# Patient Record
Sex: Female | Born: 1965 | Race: White | Hispanic: No | Marital: Married | Smoking: Never smoker
Health system: Southern US, Community
[De-identification: ages and names within clinical notes are randomized; demographics above are authoritative.]

## PROBLEM LIST (undated history)

## (undated) DIAGNOSIS — N920 Excessive and frequent menstruation with regular cycle: Secondary | ICD-10-CM

## (undated) HISTORY — DX: Excessive and frequent menstruation with regular cycle: N92.0

## (undated) HISTORY — PX: TUBAL LIGATION: SHX77

## (undated) HISTORY — PX: OOPHORECTOMY: SHX86

## (undated) HISTORY — PX: TONSILLECTOMY: SUR1361

---

## 2016-01-22 ENCOUNTER — Encounter: Payer: Self-pay | Admitting: Gastroenterology

## 2016-03-14 ENCOUNTER — Ambulatory Visit (AMBULATORY_SURGERY_CENTER): Payer: Self-pay

## 2016-03-14 VITALS — Ht 66.0 in | Wt 146.8 lb

## 2016-03-14 DIAGNOSIS — Z1211 Encounter for screening for malignant neoplasm of colon: Secondary | ICD-10-CM

## 2016-03-14 MED ORDER — SUPREP BOWEL PREP KIT 17.5-3.13-1.6 GM/177ML PO SOLN: 1.0000 | Freq: Once | ORAL | 0 refills | Status: AC

## 2016-03-14 NOTE — Progress Notes (Signed)
No allergies to eggs or soy No past problems with anesthesia or airway No diet meds No home oxygen  Registered for emmi

## 2016-03-18 ENCOUNTER — Encounter: Payer: Self-pay | Admitting: Gastroenterology

## 2016-03-28 ENCOUNTER — Encounter: Payer: Self-pay | Admitting: Gastroenterology

## 2016-05-28 ENCOUNTER — Ambulatory Visit (AMBULATORY_SURGERY_CENTER): Payer: 59 | Admitting: Gastroenterology

## 2016-05-28 ENCOUNTER — Encounter: Payer: Self-pay | Admitting: Gastroenterology

## 2016-05-28 VITALS — BP 102/64 | HR 64 | Temp 98.4°F | Resp 15 | Ht 66.0 in | Wt 146.0 lb

## 2016-05-28 DIAGNOSIS — D125 Benign neoplasm of sigmoid colon: Secondary | ICD-10-CM

## 2016-05-28 DIAGNOSIS — Z1211 Encounter for screening for malignant neoplasm of colon: Secondary | ICD-10-CM | POA: Diagnosis not present

## 2016-05-28 DIAGNOSIS — Z1212 Encounter for screening for malignant neoplasm of rectum: Secondary | ICD-10-CM | POA: Diagnosis not present

## 2016-05-28 DIAGNOSIS — K635 Polyp of colon: Secondary | ICD-10-CM | POA: Diagnosis not present

## 2016-05-28 MED ORDER — SODIUM CHLORIDE 0.9 % IV SOLN: 500.0000 mL | INTRAVENOUS | Status: AC

## 2016-05-28 NOTE — Progress Notes (Signed)
Pt's states no medical or surgical changes since previsit or office visit. 

## 2016-05-28 NOTE — Progress Notes (Signed)
To recovery, report to Jones, RN, VSS 

## 2016-05-28 NOTE — Op Note (Signed)
[facility] Patient Name: Colleen Wolf Procedure Date: 05/28/2016 8:56 AM MRN: [id number] Endoscopist: Milus Banister , MD Age: 51 Referring MD:  Date of Birth: Mar 01, 1966 Gender: Female Account #: [id number] Procedure:                Colonoscopy Indications:              Screening for colorectal malignant neoplasm Medicines:                Monitored Anesthesia Care Procedure:                Pre-Anesthesia Assessment:                           - Prior to the procedure, a History and Physical                            was performed, and patient medications and                            allergies were reviewed. The patient's tolerance of                            previous anesthesia was also reviewed. The risks                            and benefits of the procedure and the sedation                            options and risks were discussed with the patient.                            All questions were answered, and informed consent                            was obtained. Prior Anticoagulants: The patient has                            taken no previous anticoagulant or antiplatelet                            agents. ASA Grade Assessment: II - A patient with                            mild systemic disease. After reviewing the risks                            and benefits, the patient was deemed in                            satisfactory condition to undergo the procedure.                           After obtaining informed consent, the colonoscope  was passed under direct vision. Throughout the                            procedure, the patient's blood pressure, pulse, and                            oxygen saturations were monitored continuously. The                            Model CF-HQ190L [id number]) scope was introduced                            through the anus and advanced to the the cecum,                            identified by  appendiceal orifice and ileocecal                            valve. The colonoscopy was performed without                            difficulty. The patient tolerated the procedure                            well. The quality of the bowel preparation was                            good. The ileocecal valve, appendiceal orifice, and                            rectum were photographed. Scope In: 8:57:52 AM Scope Out: 9:09:02 AM Scope Withdrawal Time: 0 hours 7 minutes 6 seconds  Total Procedure Duration: 0 hours 11 minutes 10 seconds  Findings:                 A 5 mm polyp was found in the sigmoid colon. The                            polyp was sessile. The polyp was removed with a                            cold snare. Resection and retrieval were complete.                           The exam was otherwise without abnormality on                            direct and retroflexion views. Complications:            No immediate complications. Estimated blood loss:                            None. Estimated Blood Loss:     Estimated blood loss: none. Impression:               -  One 5 mm polyp in the sigmoid colon, removed with                            a cold snare. Resected and retrieved.                           - The examination was otherwise normal on direct                            and retroflexion views. Recommendation:           - Patient has a contact number available for                            emergencies. The signs and symptoms of potential                            delayed complications were discussed with the                            patient. Return to normal activities tomorrow.                            Written discharge instructions were provided to the                            patient.                           - Resume previous diet.                           - Continue present medications.                           You will receive a letter within 2-3 weeks with  the                            pathology results and my final recommendations.                           If the polyp(s) is proven to be 'pre-cancerous' on                            pathology, you will need repeat colonoscopy in 5                            years. If the polyp(s) is NOT 'precancerous' on                            pathology then you should repeat colon cancer                            screening in 10 years with colonoscopy without need  for colon cancer screening by any method prior to                            then (including stool testing). Milus Banister, MD 05/28/2016 9:10:39 AM This report has been signed electronically.

## 2016-05-28 NOTE — Progress Notes (Signed)
Called to room to assist during endoscopic procedure.  Patient ID and intended procedure confirmed with present staff. Received instructions for my participation in the procedure from the performing physician.  

## 2016-05-28 NOTE — Patient Instructions (Signed)
YOU HAD AN ENDOSCOPIC PROCEDURE TODAY AT [facility]:   Refer to the procedure report that was given to you for any specific questions about what was found during the examination.  If the procedure report does not answer your questions, please call your gastroenterologist to clarify.  If you requested that your care partner not be given the details of your procedure findings, then the procedure report has been included in a sealed envelope for you to review at your convenience later.  YOU SHOULD EXPECT: Some feelings of bloating in the abdomen. Passage of more gas than usual.  Walking can help get rid of the air that was put into your GI tract during the procedure and reduce the bloating. If you had a lower endoscopy (such as a colonoscopy or flexible sigmoidoscopy) you may notice spotting of blood in your stool or on the toilet paper. If you underwent a bowel prep for your procedure, you may not have a normal bowel movement for a few days.  Please Note:  You might notice some irritation and congestion in your nose or some drainage.  This is from the oxygen used during your procedure.  There is no need for concern and it should clear up in a day or so.  SYMPTOMS TO REPORT IMMEDIATELY:   Following lower endoscopy (colonoscopy or flexible sigmoidoscopy):  Excessive amounts of blood in the stool  Significant tenderness or worsening of abdominal pains  Swelling of the abdomen that is new, acute  Fever of 100F or higher  For urgent or emergent issues, a gastroenterologist can be reached at any hour by calling [phone].   DIET:  We do recommend a small meal at first, but then you may proceed to your regular diet.  Drink plenty of fluids but you should avoid alcoholic beverages for 24 hours.  ACTIVITY:  You should plan to take it easy for the rest of today and you should NOT DRIVE or use heavy machinery until tomorrow (because of the sedation medicines used during the test).     FOLLOW UP: Our staff will call the number listed on your records the next business day following your procedure to check on you and address any questions or concerns that you may have regarding the information given to you following your procedure. If we do not reach you, we will leave a message.  However, if you are feeling well and you are not experiencing any problems, there is no need to return our call.  We will assume that you have returned to your regular daily activities without incident.  If any biopsies were taken you will be contacted by phone or by letter within the next 1-3 weeks.  Please call us at [phone] if you have not heard about the biopsies in 3 weeks.   Polyps (handout given) Await biopsy results to determined next repeat Colonoscopy   SIGNATURES/CONFIDENTIALITY: You and/or your care partner have signed paperwork which will be entered into your electronic medical record.  These signatures attest to the fact that that the information above on your After Visit Summary has been reviewed and is understood.  Full responsibility of the confidentiality of this discharge information lies with you and/or your care-partner.

## 2016-05-29 ENCOUNTER — Telehealth: Payer: Self-pay | Admitting: *Deleted

## 2016-05-29 ENCOUNTER — Telehealth: Payer: Self-pay

## 2016-05-29 NOTE — Telephone Encounter (Signed)
Left message on f/u call 

## 2016-05-29 NOTE — Telephone Encounter (Signed)
  Follow up Call-  Call back number 05/28/2016  Post procedure Call Back phone  # [phone]  Permission to leave phone message Yes     Patient questions:  Do you have a fever, pain , or abdominal swelling? No. Pain Score  0 *  Have you tolerated food without any problems? Yes.    Have you been able to return to your normal activities? Yes.    Do you have any questions about your discharge instructions: Diet   No. Medications  No. Follow up visit  No.  Do you have questions or concerns about your Care? No.  Actions: * If pain score is 4 or above: No action needed, pain <4.

## 2016-06-05 ENCOUNTER — Encounter: Payer: Self-pay | Admitting: Gastroenterology

## 2016-07-18 ENCOUNTER — Other Ambulatory Visit: Payer: Self-pay | Admitting: Family Medicine

## 2016-07-18 DIAGNOSIS — Z1231 Encounter for screening mammogram for malignant neoplasm of breast: Secondary | ICD-10-CM

## 2016-08-19 ENCOUNTER — Encounter: Payer: Self-pay | Admitting: Radiology

## 2016-08-19 ENCOUNTER — Ambulatory Visit
Admission: RE | Admit: 2016-08-19 | Discharge: 2016-08-19 | Disposition: A | Discharge status: Home / Self Care | Payer: 59 | Source: Ambulatory Visit | Attending: Family Medicine | Admitting: Family Medicine

## 2016-08-19 DIAGNOSIS — Z1231 Encounter for screening mammogram for malignant neoplasm of breast: Secondary | ICD-10-CM

## 2016-09-15 DIAGNOSIS — Z23 Encounter for immunization: Secondary | ICD-10-CM | POA: Diagnosis not present

## 2017-01-09 DIAGNOSIS — Z23 Encounter for immunization: Secondary | ICD-10-CM | POA: Diagnosis not present

## 2017-07-20 DIAGNOSIS — Z13220 Encounter for screening for lipoid disorders: Secondary | ICD-10-CM | POA: Diagnosis not present

## 2017-07-23 ENCOUNTER — Other Ambulatory Visit: Payer: Self-pay | Admitting: Family Medicine

## 2017-07-23 DIAGNOSIS — Z1211 Encounter for screening for malignant neoplasm of colon: Secondary | ICD-10-CM | POA: Diagnosis not present

## 2017-07-23 DIAGNOSIS — Z0000 Encounter for general adult medical examination without abnormal findings: Secondary | ICD-10-CM | POA: Diagnosis not present

## 2017-07-23 DIAGNOSIS — Z6820 Body mass index (BMI) 20.0-20.9, adult: Secondary | ICD-10-CM | POA: Diagnosis not present

## 2017-07-23 DIAGNOSIS — Z1231 Encounter for screening mammogram for malignant neoplasm of breast: Secondary | ICD-10-CM

## 2017-08-21 ENCOUNTER — Other Ambulatory Visit: Payer: Self-pay | Admitting: Family Medicine

## 2017-08-21 ENCOUNTER — Ambulatory Visit
Admission: RE | Admit: 2017-08-21 | Discharge: 2017-08-21 | Disposition: A | Discharge status: Home / Self Care | Payer: BLUE CROSS/BLUE SHIELD | Source: Ambulatory Visit | Attending: Family Medicine | Admitting: Family Medicine

## 2017-08-21 DIAGNOSIS — Z1231 Encounter for screening mammogram for malignant neoplasm of breast: Secondary | ICD-10-CM

## 2018-06-29 DIAGNOSIS — Z0000 Encounter for general adult medical examination without abnormal findings: Secondary | ICD-10-CM | POA: Diagnosis not present

## 2018-06-29 DIAGNOSIS — Z114 Encounter for screening for human immunodeficiency virus [HIV]: Secondary | ICD-10-CM | POA: Diagnosis not present

## 2018-06-29 DIAGNOSIS — Z1329 Encounter for screening for other suspected endocrine disorder: Secondary | ICD-10-CM | POA: Diagnosis not present

## 2018-06-29 DIAGNOSIS — Z13220 Encounter for screening for lipoid disorders: Secondary | ICD-10-CM | POA: Diagnosis not present

## 2018-07-06 DIAGNOSIS — Z1211 Encounter for screening for malignant neoplasm of colon: Secondary | ICD-10-CM | POA: Diagnosis not present

## 2018-07-06 DIAGNOSIS — Z6821 Body mass index (BMI) 21.0-21.9, adult: Secondary | ICD-10-CM | POA: Diagnosis not present

## 2018-07-06 DIAGNOSIS — Z0000 Encounter for general adult medical examination without abnormal findings: Secondary | ICD-10-CM | POA: Diagnosis not present

## 2018-07-22 ENCOUNTER — Other Ambulatory Visit: Payer: Self-pay | Admitting: Family Medicine

## 2018-07-22 DIAGNOSIS — Z1231 Encounter for screening mammogram for malignant neoplasm of breast: Secondary | ICD-10-CM

## 2018-09-14 ENCOUNTER — Ambulatory Visit
Admission: RE | Admit: 2018-09-14 | Discharge: 2018-09-14 | Disposition: A | Discharge status: Home / Self Care | Payer: BLUE CROSS/BLUE SHIELD | Source: Ambulatory Visit | Attending: Family Medicine | Admitting: Family Medicine

## 2018-09-14 ENCOUNTER — Other Ambulatory Visit: Payer: Self-pay

## 2018-09-14 DIAGNOSIS — Z1231 Encounter for screening mammogram for malignant neoplasm of breast: Secondary | ICD-10-CM

## 2018-12-31 DIAGNOSIS — L255 Unspecified contact dermatitis due to plants, except food: Secondary | ICD-10-CM | POA: Diagnosis not present

## 2019-02-07 ENCOUNTER — Other Ambulatory Visit: Payer: Self-pay

## 2019-02-07 DIAGNOSIS — Z20822 Contact with and (suspected) exposure to covid-19: Secondary | ICD-10-CM

## 2019-02-07 DIAGNOSIS — Z20828 Contact with and (suspected) exposure to other viral communicable diseases: Secondary | ICD-10-CM | POA: Diagnosis not present

## 2019-02-08 LAB — NOVEL CORONAVIRUS, NAA: SARS-CoV-2, NAA: NOT DETECTED

## 2019-07-05 DIAGNOSIS — Z0000 Encounter for general adult medical examination without abnormal findings: Secondary | ICD-10-CM | POA: Diagnosis not present

## 2019-07-05 DIAGNOSIS — Z0184 Encounter for antibody response examination: Secondary | ICD-10-CM | POA: Diagnosis not present

## 2019-07-12 DIAGNOSIS — Z1211 Encounter for screening for malignant neoplasm of colon: Secondary | ICD-10-CM | POA: Diagnosis not present

## 2019-07-12 DIAGNOSIS — Z01419 Encounter for gynecological examination (general) (routine) without abnormal findings: Secondary | ICD-10-CM | POA: Diagnosis not present

## 2019-07-12 DIAGNOSIS — Z6821 Body mass index (BMI) 21.0-21.9, adult: Secondary | ICD-10-CM | POA: Diagnosis not present

## 2019-07-12 DIAGNOSIS — Z0000 Encounter for general adult medical examination without abnormal findings: Secondary | ICD-10-CM | POA: Diagnosis not present

## 2019-09-13 DIAGNOSIS — Z23 Encounter for immunization: Secondary | ICD-10-CM | POA: Diagnosis not present

## 2019-11-24 DIAGNOSIS — Z23 Encounter for immunization: Secondary | ICD-10-CM | POA: Diagnosis not present

## 2020-07-20 DIAGNOSIS — E559 Vitamin D deficiency, unspecified: Secondary | ICD-10-CM | POA: Diagnosis not present

## 2020-07-20 DIAGNOSIS — Z13220 Encounter for screening for lipoid disorders: Secondary | ICD-10-CM | POA: Diagnosis not present

## 2020-07-20 DIAGNOSIS — Z0000 Encounter for general adult medical examination without abnormal findings: Secondary | ICD-10-CM | POA: Diagnosis not present

## 2020-07-25 DIAGNOSIS — Z0000 Encounter for general adult medical examination without abnormal findings: Secondary | ICD-10-CM | POA: Diagnosis not present

## 2020-07-25 DIAGNOSIS — E559 Vitamin D deficiency, unspecified: Secondary | ICD-10-CM | POA: Diagnosis not present

## 2020-07-25 DIAGNOSIS — N952 Postmenopausal atrophic vaginitis: Secondary | ICD-10-CM | POA: Diagnosis not present

## 2020-07-25 DIAGNOSIS — Z780 Asymptomatic menopausal state: Secondary | ICD-10-CM | POA: Diagnosis not present

## 2020-07-25 DIAGNOSIS — Z1211 Encounter for screening for malignant neoplasm of colon: Secondary | ICD-10-CM | POA: Diagnosis not present

## 2020-08-07 ENCOUNTER — Other Ambulatory Visit: Payer: Self-pay | Admitting: Family Medicine

## 2020-08-07 DIAGNOSIS — Z1231 Encounter for screening mammogram for malignant neoplasm of breast: Secondary | ICD-10-CM

## 2020-10-03 ENCOUNTER — Ambulatory Visit
Admission: RE | Admit: 2020-10-03 | Discharge: 2020-10-03 | Disposition: A | Discharge status: Home / Self Care | Payer: BLUE CROSS/BLUE SHIELD | Source: Ambulatory Visit | Attending: Family Medicine | Admitting: Family Medicine

## 2020-10-03 ENCOUNTER — Other Ambulatory Visit: Payer: Self-pay

## 2020-10-03 ENCOUNTER — Ambulatory Visit: Payer: BLUE CROSS/BLUE SHIELD

## 2020-10-03 DIAGNOSIS — Z1231 Encounter for screening mammogram for malignant neoplasm of breast: Secondary | ICD-10-CM

## 2021-06-06 DIAGNOSIS — J309 Allergic rhinitis, unspecified: Secondary | ICD-10-CM | POA: Diagnosis not present

## 2021-06-06 DIAGNOSIS — H9313 Tinnitus, bilateral: Secondary | ICD-10-CM | POA: Diagnosis not present

## 2021-06-06 DIAGNOSIS — H65193 Other acute nonsuppurative otitis media, bilateral: Secondary | ICD-10-CM | POA: Diagnosis not present

## 2021-07-23 DIAGNOSIS — Z0000 Encounter for general adult medical examination without abnormal findings: Secondary | ICD-10-CM | POA: Diagnosis not present

## 2021-07-23 DIAGNOSIS — Z114 Encounter for screening for human immunodeficiency virus [HIV]: Secondary | ICD-10-CM | POA: Diagnosis not present

## 2021-07-29 DIAGNOSIS — Z0000 Encounter for general adult medical examination without abnormal findings: Secondary | ICD-10-CM | POA: Diagnosis not present

## 2021-07-29 DIAGNOSIS — Z1211 Encounter for screening for malignant neoplasm of colon: Secondary | ICD-10-CM | POA: Diagnosis not present

## 2021-07-31 ENCOUNTER — Other Ambulatory Visit: Payer: Self-pay | Admitting: Family Medicine

## 2021-07-31 DIAGNOSIS — Z1231 Encounter for screening mammogram for malignant neoplasm of breast: Secondary | ICD-10-CM

## 2021-10-04 ENCOUNTER — Ambulatory Visit
Admission: RE | Admit: 2021-10-04 | Discharge: 2021-10-04 | Disposition: A | Discharge status: Home / Self Care | Payer: BC Managed Care – PPO | Source: Ambulatory Visit | Attending: Family Medicine | Admitting: Family Medicine

## 2021-10-04 DIAGNOSIS — Z1231 Encounter for screening mammogram for malignant neoplasm of breast: Secondary | ICD-10-CM

## 2021-12-04 DIAGNOSIS — H903 Sensorineural hearing loss, bilateral: Secondary | ICD-10-CM | POA: Diagnosis not present

## 2021-12-04 DIAGNOSIS — H9193 Unspecified hearing loss, bilateral: Secondary | ICD-10-CM | POA: Diagnosis not present

## 2021-12-04 DIAGNOSIS — J301 Allergic rhinitis due to pollen: Secondary | ICD-10-CM | POA: Diagnosis not present

## 2021-12-04 DIAGNOSIS — H9313 Tinnitus, bilateral: Secondary | ICD-10-CM | POA: Diagnosis not present

## 2022-06-30 DIAGNOSIS — Z13220 Encounter for screening for lipoid disorders: Secondary | ICD-10-CM | POA: Diagnosis not present

## 2022-06-30 DIAGNOSIS — Z0000 Encounter for general adult medical examination without abnormal findings: Secondary | ICD-10-CM | POA: Diagnosis not present

## 2022-06-30 DIAGNOSIS — E782 Mixed hyperlipidemia: Secondary | ICD-10-CM | POA: Diagnosis not present

## 2022-06-30 DIAGNOSIS — Z114 Encounter for screening for human immunodeficiency virus [HIV]: Secondary | ICD-10-CM | POA: Diagnosis not present

## 2022-07-09 DIAGNOSIS — L578 Other skin changes due to chronic exposure to nonionizing radiation: Secondary | ICD-10-CM | POA: Diagnosis not present

## 2022-07-09 DIAGNOSIS — Z0000 Encounter for general adult medical examination without abnormal findings: Secondary | ICD-10-CM | POA: Diagnosis not present

## 2022-07-09 DIAGNOSIS — L814 Other melanin hyperpigmentation: Secondary | ICD-10-CM | POA: Diagnosis not present

## 2022-07-09 DIAGNOSIS — D225 Melanocytic nevi of trunk: Secondary | ICD-10-CM | POA: Diagnosis not present

## 2022-07-09 DIAGNOSIS — L821 Other seborrheic keratosis: Secondary | ICD-10-CM | POA: Diagnosis not present

## 2023-05-11 DIAGNOSIS — Z0000 Encounter for general adult medical examination without abnormal findings: Secondary | ICD-10-CM | POA: Diagnosis not present

## 2023-05-11 DIAGNOSIS — Z13220 Encounter for screening for lipoid disorders: Secondary | ICD-10-CM | POA: Diagnosis not present

## 2023-05-11 DIAGNOSIS — Z114 Encounter for screening for human immunodeficiency virus [HIV]: Secondary | ICD-10-CM | POA: Diagnosis not present

## 2023-05-14 DIAGNOSIS — Z0000 Encounter for general adult medical examination without abnormal findings: Secondary | ICD-10-CM | POA: Diagnosis not present

## 2023-05-15 ENCOUNTER — Other Ambulatory Visit: Payer: Self-pay | Admitting: Family Medicine

## 2023-05-15 DIAGNOSIS — Z0000 Encounter for general adult medical examination without abnormal findings: Secondary | ICD-10-CM

## 2023-05-15 DIAGNOSIS — E559 Vitamin D deficiency, unspecified: Secondary | ICD-10-CM

## 2023-05-18 ENCOUNTER — Ambulatory Visit
Admission: RE | Admit: 2023-05-18 | Discharge: 2023-05-18 | Discharge status: Home / Self Care | Payer: BC Managed Care – PPO | Source: Ambulatory Visit | Attending: Family Medicine | Admitting: Family Medicine

## 2023-05-18 DIAGNOSIS — N958 Other specified menopausal and perimenopausal disorders: Secondary | ICD-10-CM | POA: Diagnosis not present

## 2023-05-18 DIAGNOSIS — E2839 Other primary ovarian failure: Secondary | ICD-10-CM | POA: Diagnosis not present

## 2023-05-18 DIAGNOSIS — E559 Vitamin D deficiency, unspecified: Secondary | ICD-10-CM

## 2023-05-28 ENCOUNTER — Ambulatory Visit
Admission: RE | Admit: 2023-05-28 | Discharge: 2023-05-28 | Disposition: A | Discharge status: Home / Self Care | Payer: BC Managed Care – PPO | Source: Ambulatory Visit | Attending: Family Medicine | Admitting: Family Medicine

## 2023-05-28 DIAGNOSIS — Z0000 Encounter for general adult medical examination without abnormal findings: Secondary | ICD-10-CM

## 2023-05-28 DIAGNOSIS — Z1231 Encounter for screening mammogram for malignant neoplasm of breast: Secondary | ICD-10-CM | POA: Diagnosis not present

## 2023-07-09 DIAGNOSIS — L578 Other skin changes due to chronic exposure to nonionizing radiation: Secondary | ICD-10-CM | POA: Diagnosis not present

## 2023-07-09 DIAGNOSIS — L814 Other melanin hyperpigmentation: Secondary | ICD-10-CM | POA: Diagnosis not present

## 2023-07-09 DIAGNOSIS — L821 Other seborrheic keratosis: Secondary | ICD-10-CM | POA: Diagnosis not present

## 2023-07-09 DIAGNOSIS — D225 Melanocytic nevi of trunk: Secondary | ICD-10-CM | POA: Diagnosis not present

## 2024-06-03 IMAGING — MG MM DIGITAL SCREENING BILAT W/ TOMO AND CAD
8 series · 9 of 24 positions shown · non-contrast
Comparison: Previous exam(s).

CLINICAL DATA: Screening.

EXAM:
DIGITAL SCREENING BILATERAL MAMMOGRAM WITH TOMOSYNTHESIS AND CAD
TECHNIQUE: Bilateral screening digital craniocaudal and mediolateral oblique
mammograms were obtained. Bilateral screening digital breast
tomosynthesis was performed. The images were evaluated with
computer-aided detection.

[R MLO synth-2D]
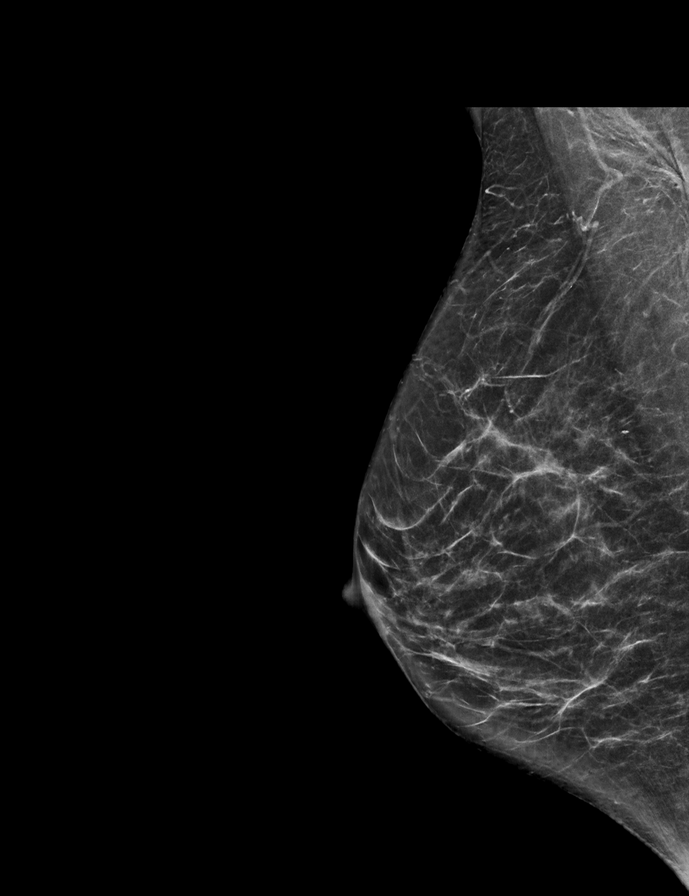

[L CC synth-2D]
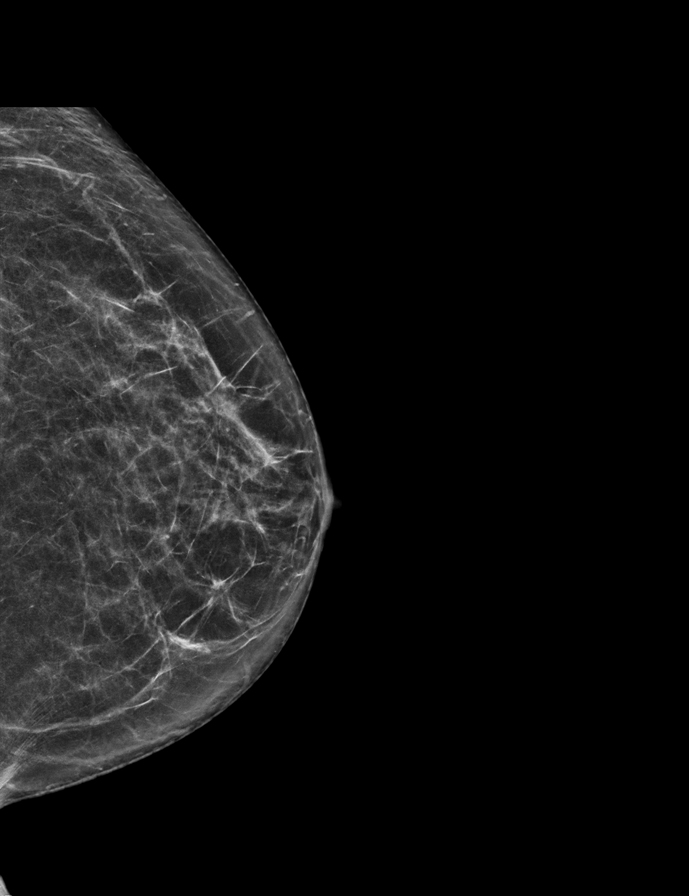

[L MLO synth-2D]
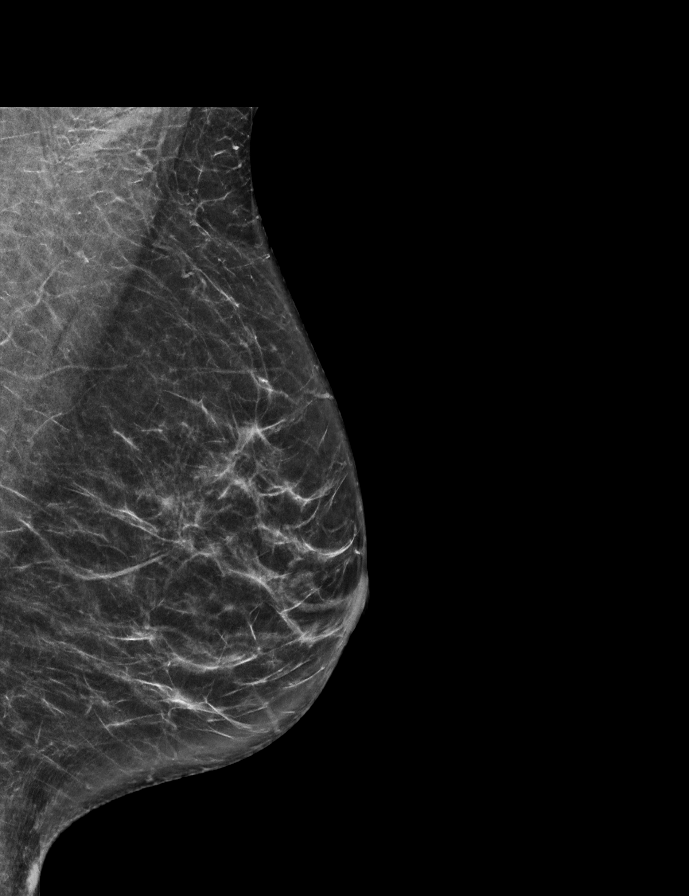

[R CC synth-2D]
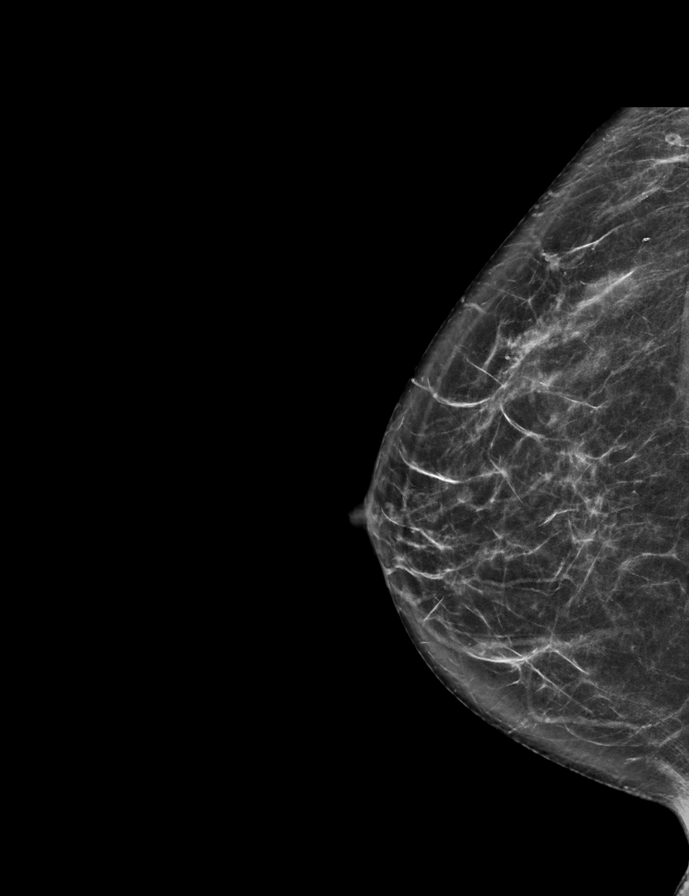

[R MLO tomo · 2 of 62 frames shown]
[frame 21/62]
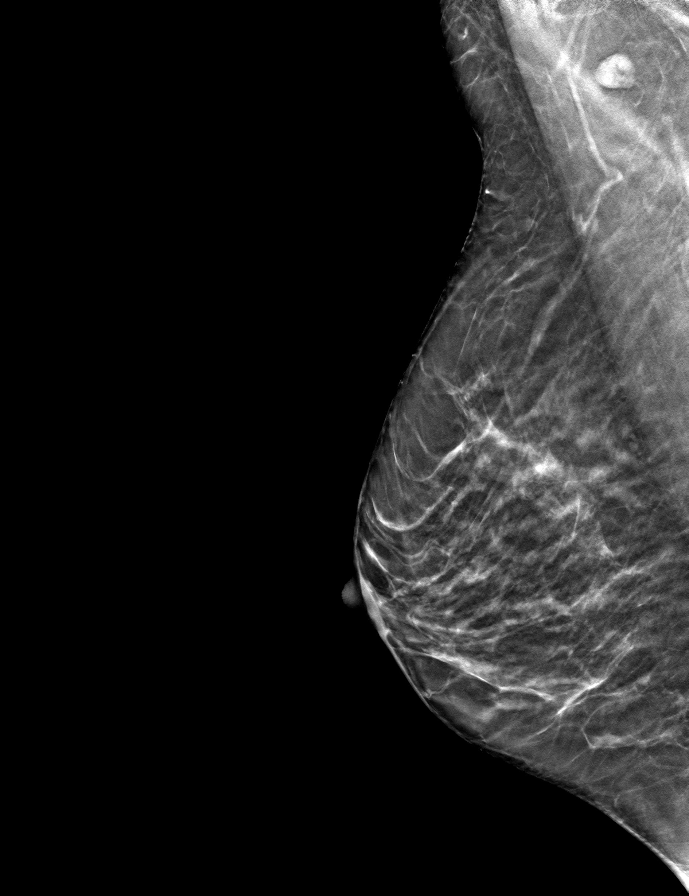
[frame 31/62]
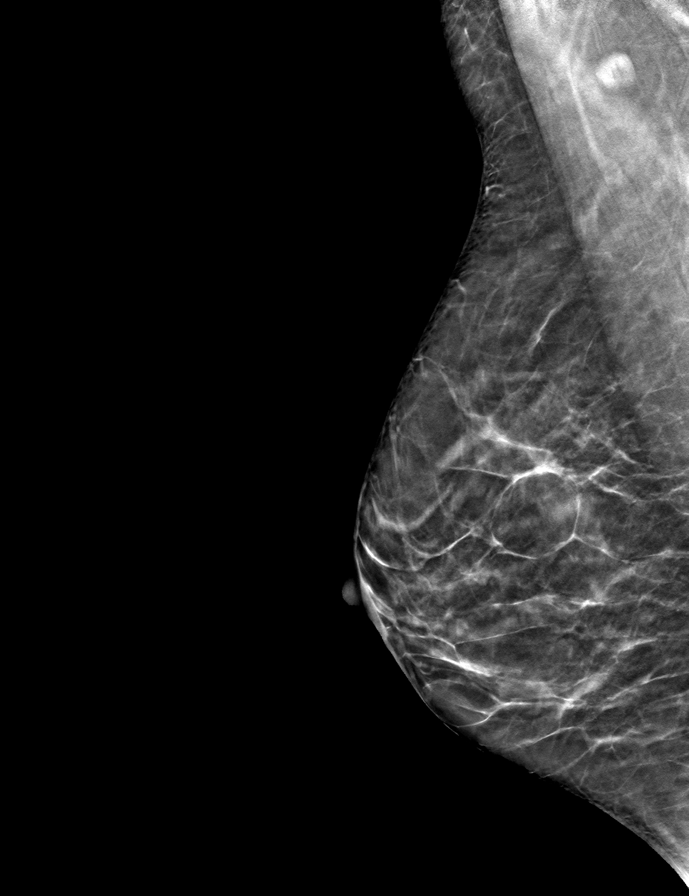

[L CC tomo · tomo slice 32/63.0]
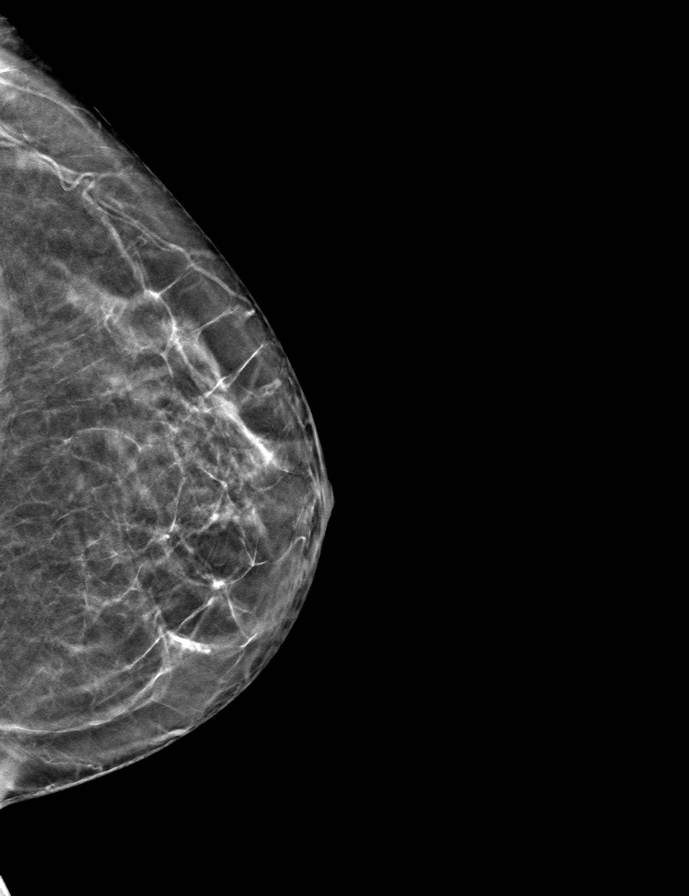

[R CC tomo · tomo slice 33/64.0]
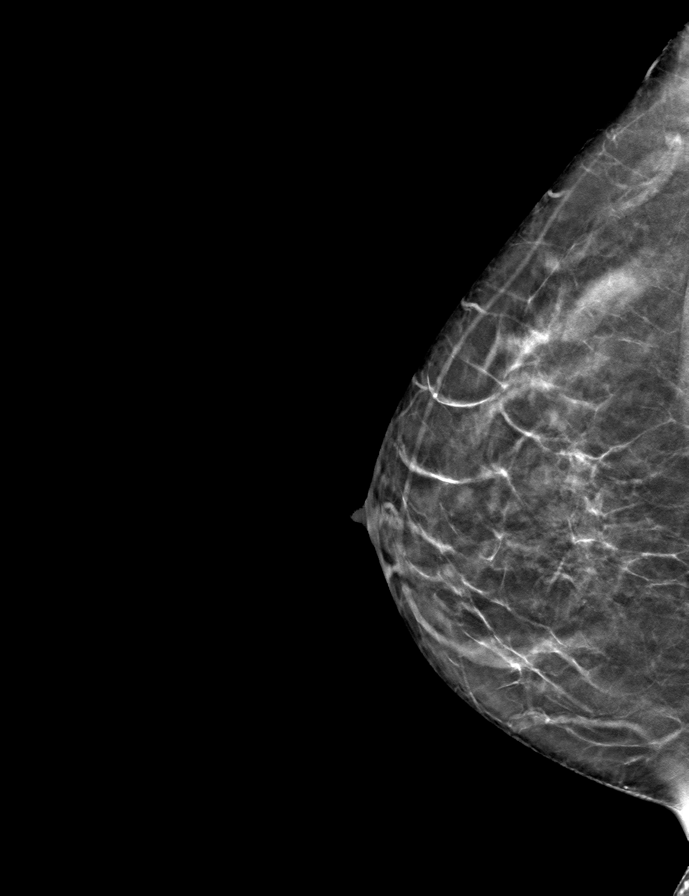

[L MLO tomo · tomo slice 32/63.0]
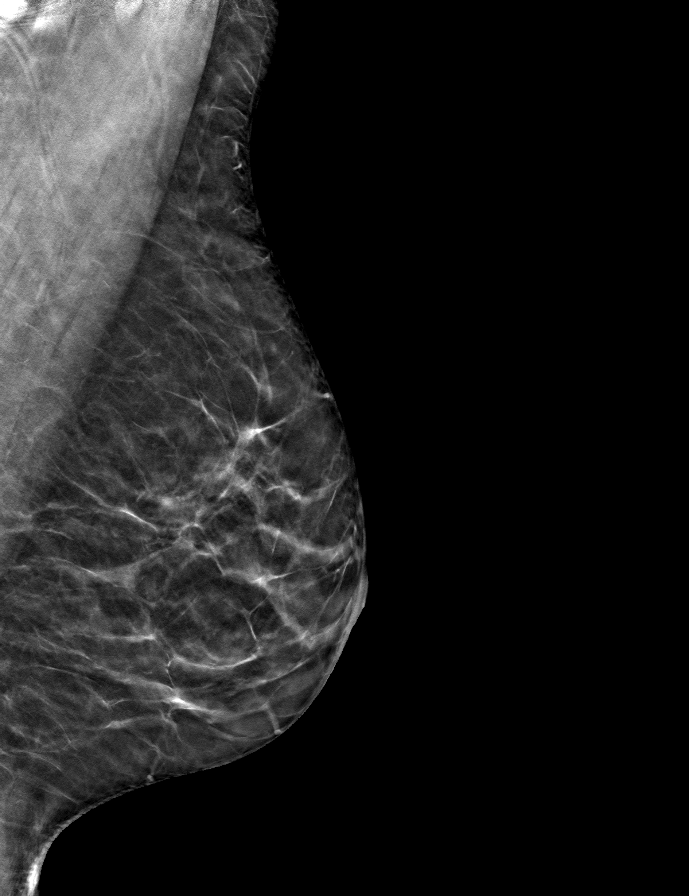

[9 of 24 positions shown; findings below may reference images not displayed]

ACR Breast Density Category b: There are scattered areas of
fibroglandular density.
FINDINGS: There are no findings suspicious for malignancy.
IMPRESSION: No mammographic evidence of malignancy. A result letter of this
screening mammogram will be mailed directly to the patient.

RECOMMENDATION:
Screening mammogram in one year. (Code:51-O-LD2)

BI-RADS CATEGORY  1: Negative.
